# Patient Record
Sex: Male | Born: 1985 | Race: White | Hispanic: No | Marital: Single | Smoking: Current every day smoker
Health system: Southern US, Community
[De-identification: ages and names within clinical notes are randomized; demographics above are authoritative.]

---

## 2015-10-17 ENCOUNTER — Emergency Department
Admission: EM | Admit: 2015-10-17 | Discharge: 2015-10-17 | Disposition: A | Discharge status: Home / Self Care | Payer: BLUE CROSS/BLUE SHIELD | Source: Home / Self Care | Attending: Family Medicine | Admitting: Family Medicine

## 2015-10-17 ENCOUNTER — Emergency Department (INDEPENDENT_AMBULATORY_CARE_PROVIDER_SITE_OTHER): Payer: BLUE CROSS/BLUE SHIELD

## 2015-10-17 ENCOUNTER — Encounter: Payer: Self-pay | Admitting: *Deleted

## 2015-10-17 DIAGNOSIS — S92255A Nondisplaced fracture of navicular [scaphoid] of left foot, initial encounter for closed fracture: Secondary | ICD-10-CM | POA: Diagnosis not present

## 2015-10-17 DIAGNOSIS — X58XXXA Exposure to other specified factors, initial encounter: Secondary | ICD-10-CM | POA: Diagnosis not present

## 2015-10-17 DIAGNOSIS — S92252A Displaced fracture of navicular [scaphoid] of left foot, initial encounter for closed fracture: Secondary | ICD-10-CM | POA: Diagnosis not present

## 2015-10-17 MED ORDER — HYDROCODONE-ACETAMINOPHEN 5-325 MG PO TABS: 1.0000 | ORAL_TABLET | Freq: Four times a day (QID) | ORAL | Status: AC | PRN

## 2015-10-17 MED ORDER — IBUPROFEN 600 MG PO TABS: 600.0000 mg | ORAL_TABLET | Freq: Four times a day (QID) | ORAL | Status: AC | PRN

## 2015-10-17 NOTE — ED Notes (Signed)
Pt c/o LT foot pain x 3 days after jumping through a window in his garage. He took IBF at 12:00pm.

## 2015-10-17 NOTE — ED Provider Notes (Signed)
CSN: [id number]     Arrival date & time 10/17/15  1547 History   First MD Initiated Contact with Patient 10/17/15 1549     Chief Complaint  Patient presents with  . Foot Injury   (Consider location/radiation/quality/duration/timing/severity/associated sxs/prior Treatment) HPI  Pt is a 29yo male presenting to [facility] with c/o worsening Left foot pain and swelling that is worse with palpation, weight bearing and ambulation.  Symptoms started 3 days ago after he jumped through an 8 foot hight window in his garage, and landed on his Left foot.  Pain is aching and throbbing, 2/10 today after taking  Ibuprofen at home.  Pt states pain does keep him up at night and is a 9/10 when he walks on the foot. He has been using crutches to help himself get along. Denies any other injuries. Denies numbness or tingling in the Left foot.  History reviewed. No pertinent past medical history. History reviewed. No pertinent past surgical history. History reviewed. No pertinent family history. Social History  Substance Use Topics  . Smoking status: Current Every Day Smoker -- 0.50 packs/day    Types: Cigarettes  . Smokeless tobacco: None  . Alcohol Use: Yes     Comment: 2 drinks q wk    Review of Systems  Musculoskeletal: Positive for myalgias, joint swelling and arthralgias.       Left foot  Skin: Negative for color change and wound.  Neurological: Negative for weakness and numbness.    Allergies  Review of patient's allergies indicates no known allergies.  Home Medications   Prior to Admission medications   Medication Sig Start Date End Date Taking? Authorizing Provider  HYDROcodone-acetaminophen (NORCO/VICODIN) 5-325 MG tablet Take 1-2 tablets by mouth every 6 (six) hours as needed for moderate pain or severe pain. 10/17/15   Junius Finner, PA-C  ibuprofen (ADVIL,MOTRIN) 600 MG tablet Take 1 tablet (600 mg total) by mouth every 6 (six) hours as needed. 10/17/15   Junius Finner, PA-C   Meds  Ordered and Administered this Visit  Medications - No data to display  BP 123/80 mmHg  Pulse 75  Temp(Src) 98.4 F (36.9 C) (Oral)  Resp 16  Ht 6' (1.829 m)  Wt 185 lb (83.915 kg)  BMI 25.08 kg/m2  SpO2 100% No data found.   Physical Exam  Constitutional: He is oriented to person, place, and time. He appears well-developed and well-nourished.  HENT:  Head: Normocephalic and atraumatic.  Eyes: EOM are normal.  Neck: Normal range of motion.  Cardiovascular: Normal rate.   Pulses:      Dorsalis pedis pulses are 2+ on the left side.       Posterior tibial pulses are 2+ on the left side.  Left foot: cap refill < 3 seconds  Pulmonary/Chest: Effort normal.  Musculoskeletal: Normal range of motion. He exhibits edema and tenderness.  Left ankle: full ROM, no edema. Tenderness to medial aspect. Left foot: mild edema over medial dorsal aspect, tenderness to medial and dorsal aspect. Full ROM all toes. Calf is soft, non-tender.  Neurological: He is alert and oriented to person, place, and time.  Left foot: normal sensation  Skin: Skin is warm and dry.  Left foot: skin in tact. No ecchymosis or erythema.  Psychiatric: He has a normal mood and affect. His behavior is normal.  Nursing note and vitals reviewed.   ED Course  Procedures (including critical care time)  Labs Review Labs Reviewed - No data to display  Imaging Review Dg Ankle  Complete Left  10/17/2015  CLINICAL DATA:  Jumped out of when go with 8 foot drop. Foot and ankle pain. Symptoms  head injury 4 days ago. EXAM: LEFT ANKLE COMPLETE - 3+ VIEW COMPARISON:  None. FINDINGS: No acute bony abnormality. Specifically, no fracture, subluxation, or dislocation. Soft tissues are intact. Unable to visualize the navicular fracture as seen on foot series. IMPRESSION: No acute bony abnormality seen. Unable to visualize the navicular fracture seen on foot series. Electronically Signed   By: Charlett NoseKevin  Dover M.D.   On: 10/17/2015 16:35    Dg Foot Complete Left  10/17/2015  CLINICAL DATA:  Unable to bear weight on left foot. Pain for 4 days. Jumped out of a window with 8 foot drop. EXAM: LEFT FOOT - COMPLETE 3+ VIEW COMPARISON:  None. FINDINGS: Linear lucency through the navicular compatible with nondisplaced fracture. No additional fracture noted. Joint spaces are maintained. Soft tissues are intact. IMPRESSION: Nondisplaced navicular fracture. Electronically Signed   By: Charlett NoseKevin  Dover M.D.   On: 10/17/2015 16:33     MDM   1. Left navicular fracture of foot, closed, initial encounter     Pt c/o Left foot pain for 3 days after landing on it from jumping through his garage window. Pt denies any other injuries. Left foot: PMS in tact   Plain films: significant for a non-displaced navicular fracture of Left foot. Radiology report shows "head injury 4 days ago."  This is an incorrect typo. Pt denied any other injuries.   Pt placed in a boot. Advised to remain completely non-weight bearing until f/u with Dr. Benjamin Stainhekkekandam, Sports Medicine. Pt has crutches of his own that he brought with him to UC.  Rx: norco and ibuprofen Home care instructions provided.  Encouraged to call to schedule f/u appointment with Sports Medicine next week. Patient verbalized understanding and agreement with treatment plan.       Junius Finnerrin O'Malley, PA-C 10/17/15 [phone]

## 2015-10-17 NOTE — Discharge Instructions (Signed)
Norco/Vicodin (hydrocodone-acetaminophen) is a narcotic pain medication, do not combine these medications with others containing tylenol. While taking, do not drink alcohol, drive, or perform any other activities that requires focus while taking these medications.   PLEASE REMAIN COMPLETELY NON-WEIGHT BEARING until follow up with Dr. Benjamin Stainhekkekandam, Sports Medicine.  Use your crutches while getting around.  Your boot may be removed while bathing and while lying on a couch or sitting with foot elevated to ice the area 3-4 times a day to help reduce swelling.

## 2017-03-14 IMAGING — CR DG ANKLE COMPLETE 3+V*L*
3 series · 3 of 3 positions shown · non-contrast
Comparison: None.

ADDENDUM:
Typographical error in the clinical data which should read

Jumped out of window with 8 foot drop. Foot and ankle pain. Injury 4
days ago.
CLINICAL DATA: Jumped out of when go with 8 foot drop. Foot and
ankle pain. Symptoms ********* head injury 4 days ago.
EXAM:
LEFT ANKLE COMPLETE - 3+ VIEW

[ankle ap]
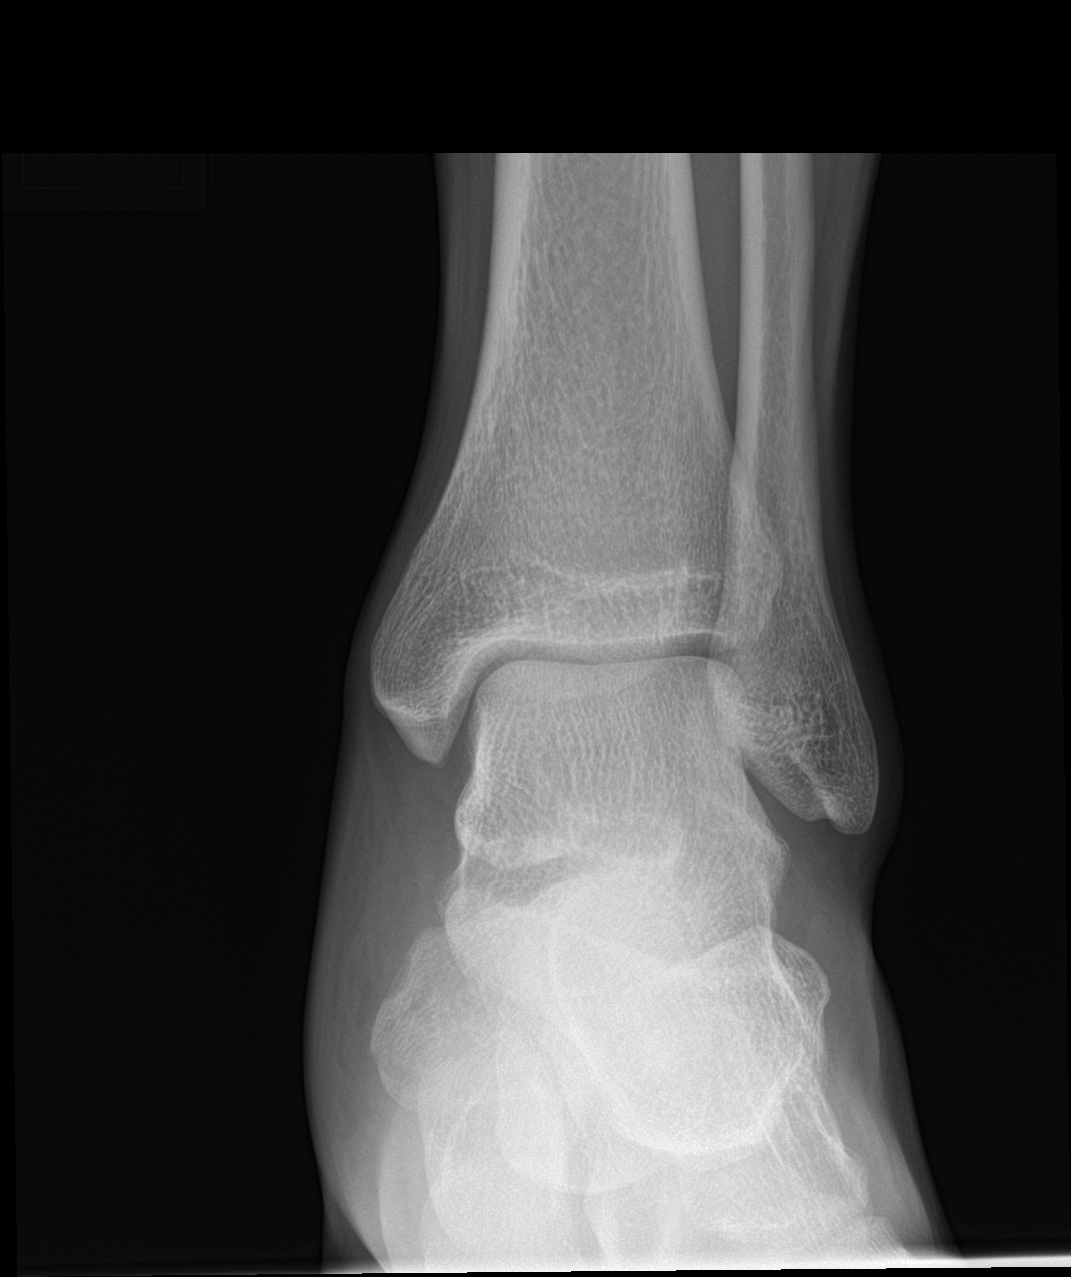

[ankle obl]
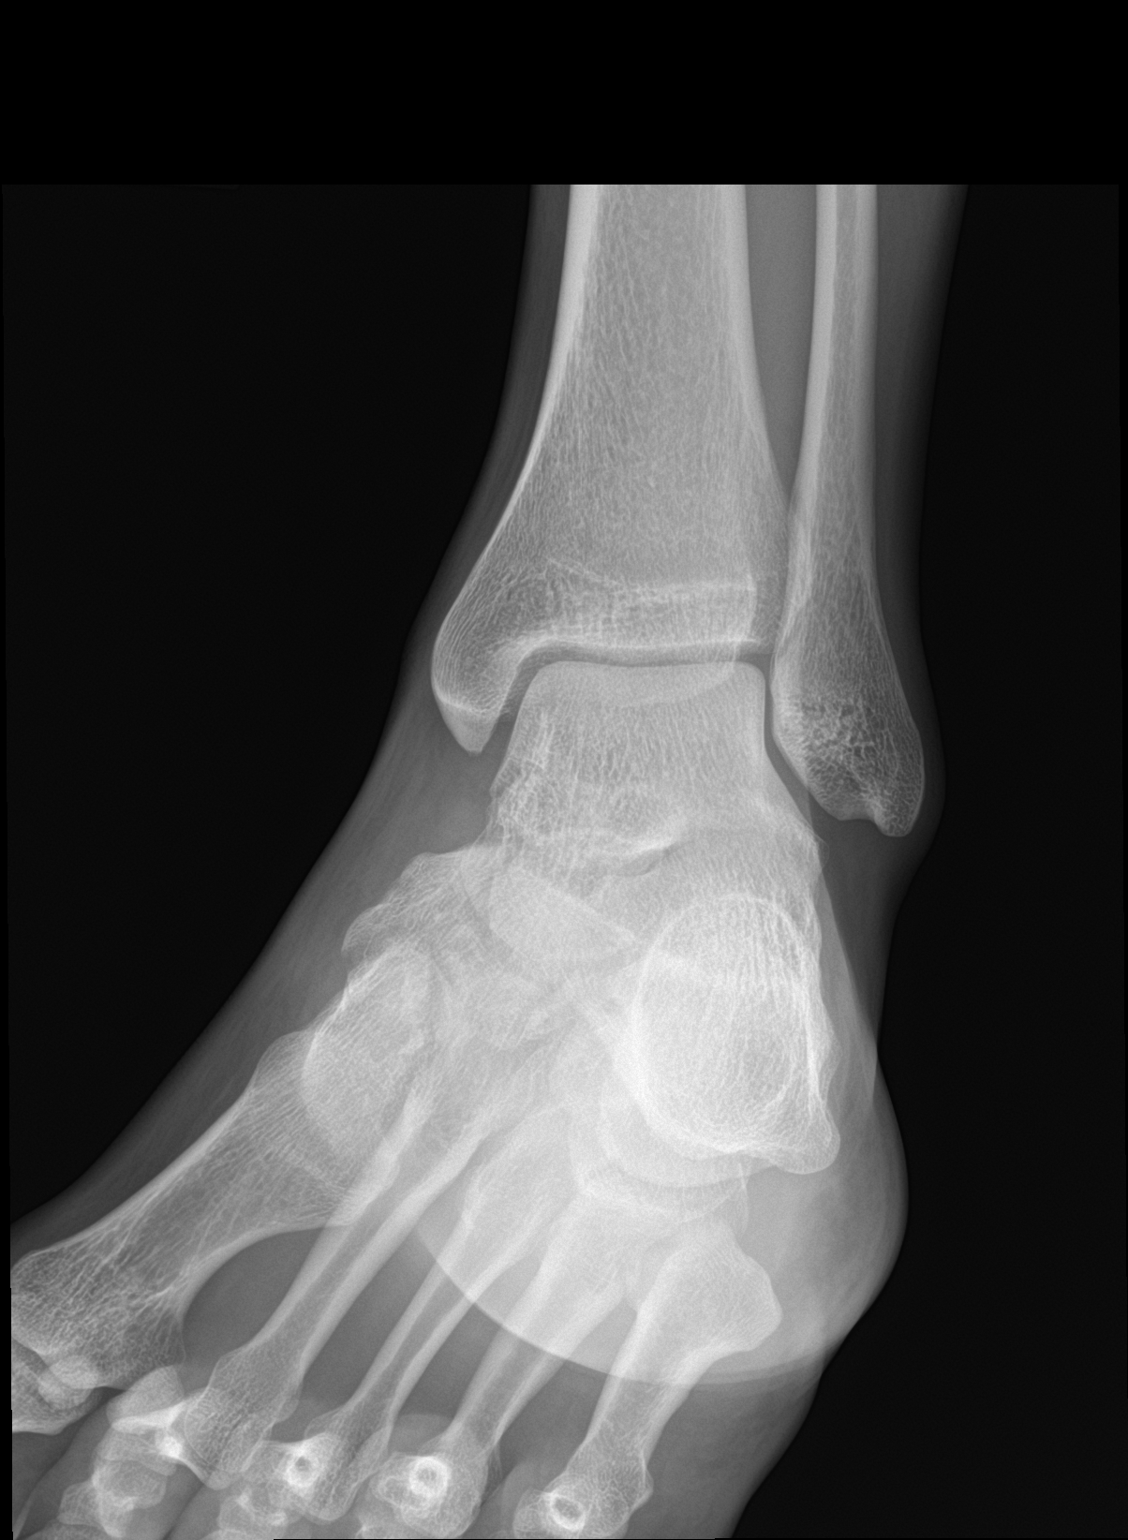

[ankle lat]
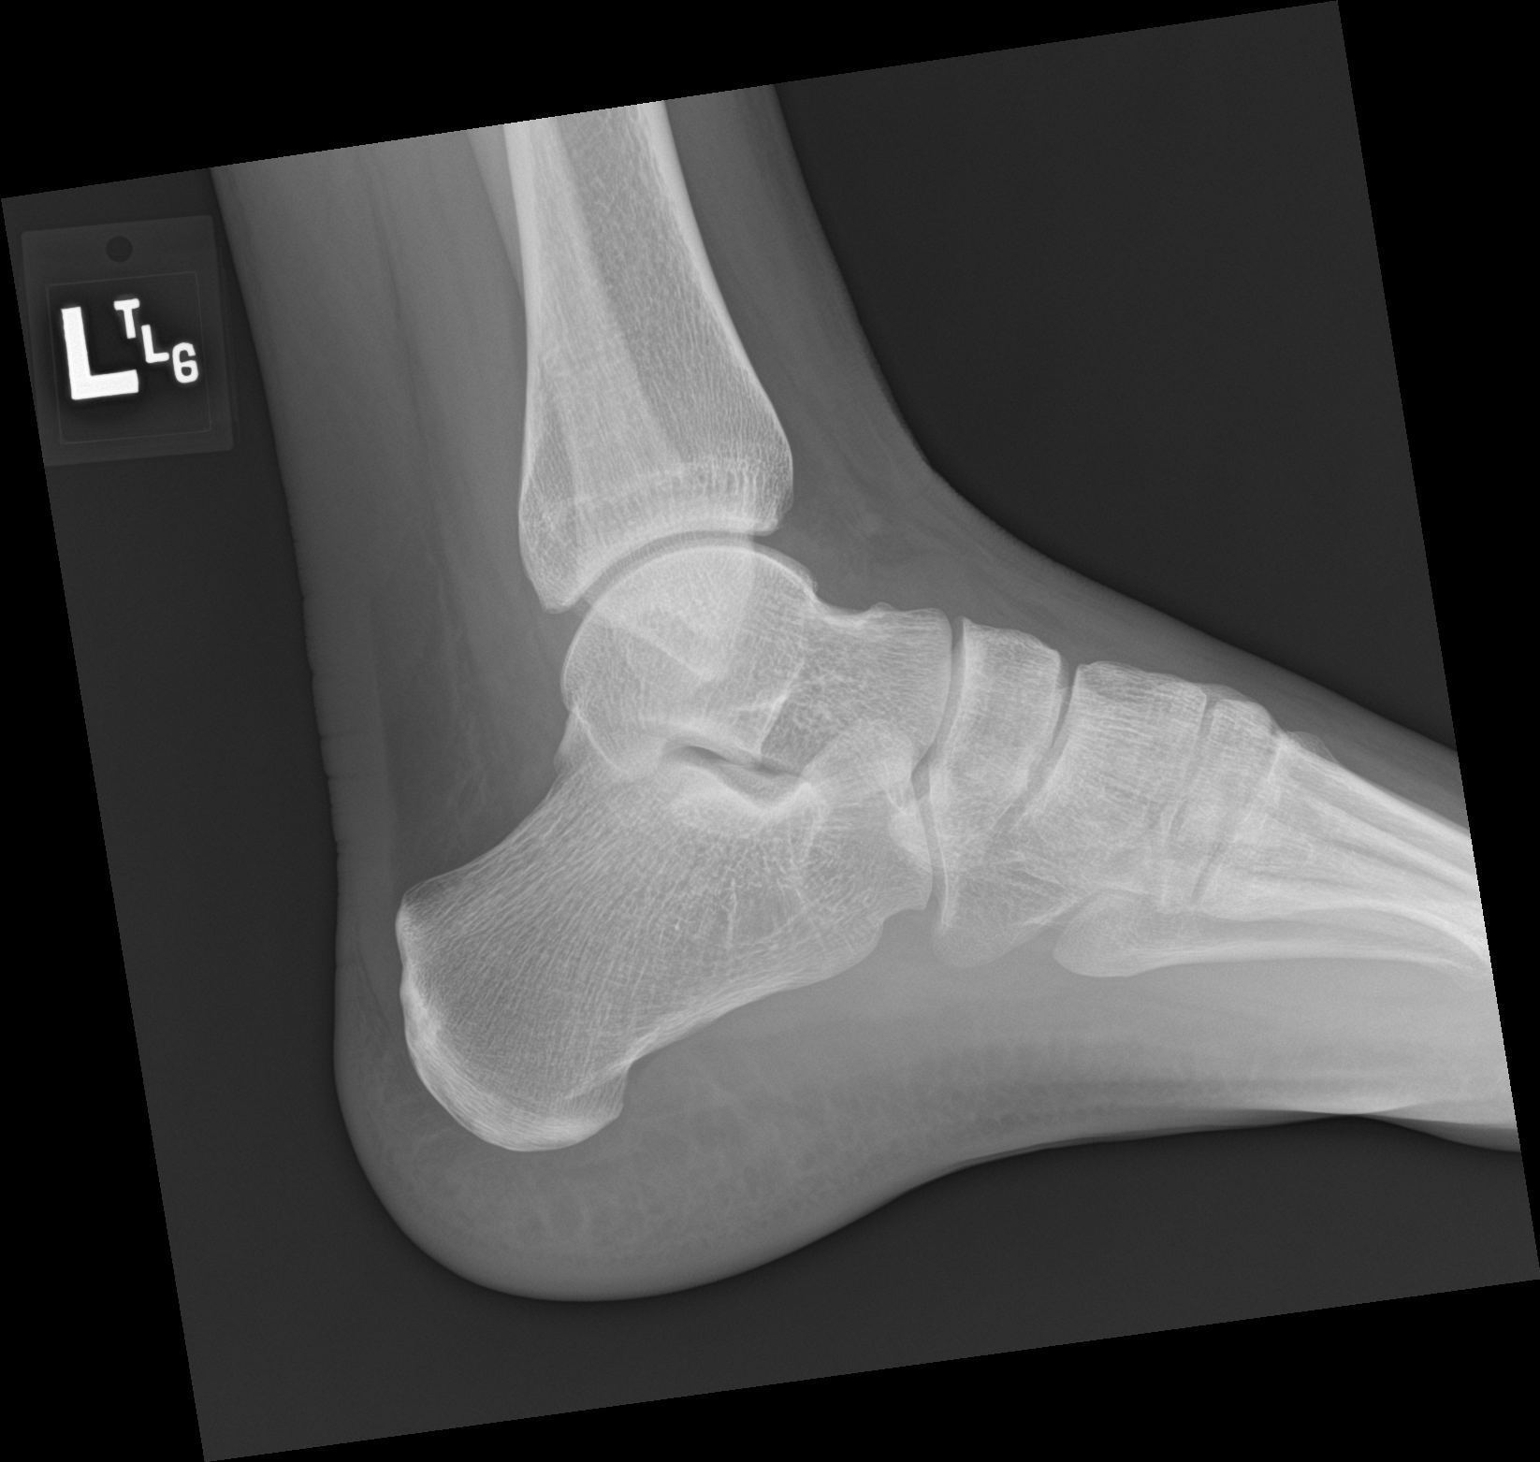

[3 of 3 positions shown; findings below may reference images not displayed]

FINDINGS: No acute bony abnormality. Specifically, no fracture, subluxation,
or dislocation. Soft tissues are intact. Unable to visualize the
navicular fracture as seen on foot series.
IMPRESSION: No acute bony abnormality seen. Unable to visualize the navicular
fracture seen on foot series.

## 2018-07-13 ENCOUNTER — Emergency Department (INDEPENDENT_AMBULATORY_CARE_PROVIDER_SITE_OTHER)
Admission: EM | Admit: 2018-07-13 | Discharge: 2018-07-13 | Disposition: A | Discharge status: Home / Self Care | Payer: BLUE CROSS/BLUE SHIELD | Source: Home / Self Care | Attending: Family Medicine | Admitting: Family Medicine

## 2018-07-13 ENCOUNTER — Other Ambulatory Visit: Payer: Self-pay

## 2018-07-13 DIAGNOSIS — S61210A Laceration without foreign body of right index finger without damage to nail, initial encounter: Secondary | ICD-10-CM | POA: Diagnosis not present

## 2018-07-13 DIAGNOSIS — Z23 Encounter for immunization: Secondary | ICD-10-CM

## 2018-07-13 MED ORDER — TETANUS-DIPHTH-ACELL PERTUSSIS 5-2.5-18.5 LF-MCG/0.5 IM SUSP
0.5000 mL | Freq: Once | INTRAMUSCULAR | Status: AC
  Administered 2018-07-13: 0.5 mL via INTRAMUSCULAR

## 2018-07-13 NOTE — ED Provider Notes (Signed)
[facility]    CSN: [id number] Arrival date & time: 07/13/18  1940     History   Chief Complaint Chief Complaint  Patient presents with  . Laceration    HPI Peter Hoffman is a 32 y.o. male.   HPI  Peter Hoffman is a 32 y.o. male presenting to UC with c/o laceration to his Right index finger after cutting it on broken glass while washing dishes. Bleeding controlled PTA. Pain is minimal. No pain medication taken PTA.  Last tetanus- unknown.    History reviewed. No pertinent past medical history.  There are no active problems to display for this patient.   History reviewed. No pertinent surgical history.     Home Medications    Prior to Admission medications   Medication Sig Start Date End Date Taking? Authorizing Provider  HYDROcodone-acetaminophen (NORCO/VICODIN) 5-325 MG tablet Take 1-2 tablets by mouth every 6 (six) hours as needed for moderate pain or severe pain. 10/17/15   Lurene ShadowPhelps, Ladonna Vanorder O, PA-C  ibuprofen (ADVIL,MOTRIN) 600 MG tablet Take 1 tablet (600 mg total) by mouth every 6 (six) hours as needed. 10/17/15   Lurene ShadowPhelps, Amrom Ore O, PA-C    Family History History reviewed. No pertinent family history.  Social History Social History   Tobacco Use  . Smoking status: Current Every Day Smoker    Packs/day: 0.50    Types: Cigarettes  Substance Use Topics  . Alcohol use: Yes    Comment: 2 drinks q wk  . Drug use: No     Allergies   Patient has no known allergies.   Review of Systems Review of Systems  Musculoskeletal: Negative for arthralgias, joint swelling and myalgias.  Skin: Positive for wound. Negative for color change.     Physical Exam Triage Vital Signs ED Triage Vitals  Enc Vitals Group     BP 07/13/18 1948 (!) 157/99     Pulse Rate 07/13/18 1948 86     Resp --      Temp 07/13/18 1948 98.7 F (37.1 C)     Temp Source 07/13/18 1948 Oral     SpO2 07/13/18 1948 95 %     Weight 07/13/18 1949 182 lb (82.6 kg)   Height 07/13/18 1949 6' (1.829 m)     Head Circumference --      Peak Flow --      Pain Score 07/13/18 1949 0     Pain Loc --      Pain Edu? --      Excl. in GC? --    No data found.  Updated Vital Signs BP (!) 157/99 (BP Location: Right Arm)   Pulse 86   Temp 98.7 F (37.1 C) (Oral)   Ht 6' (1.829 m)   Wt 182 lb (82.6 kg)   SpO2 95%   BMI 24.68 kg/m   Visual Acuity Right Eye Distance:   Left Eye Distance:   Bilateral Distance:    Right Eye Near:   Left Eye Near:    Bilateral Near:     Physical Exam  Constitutional: He is oriented to person, place, and time. He appears well-developed and well-nourished.  HENT:  Head: Normocephalic and atraumatic.  Eyes: EOM are normal.  Neck: Normal range of motion.  Cardiovascular: Normal rate.  Pulmonary/Chest: Effort normal.  Musculoskeletal: Normal range of motion. He exhibits no edema or tenderness.       Hands: Right index finger: Full ROM  Neurological: He is alert and oriented to person, place,  and time.  Skin: Skin is warm and dry. Capillary refill takes less than 2 seconds.  Right index finger, dorsal aspect, proximal phalanx: 2cm linear laceration. No tendon involvement. No foreign bodies seen or palpated. Small amount of bright red blood.   Psychiatric: He has a normal mood and affect. His behavior is normal.  Nursing note and vitals reviewed.    UC Treatments / Results  Labs (all labs ordered are listed, but only abnormal results are displayed) Labs Reviewed - No data to display  EKG None  Radiology No results found.  Procedures Laceration Repair Date/Time: 07/14/2018 9:45 AM Performed by: Lurene ShadowPhelps, Samuella Rasool O, PA-C Authorized by: Lattie HawBeese, Stephen A, MD   Consent:    Consent obtained:  Verbal   Consent given by:  Patient   Risks discussed:  Infection, pain and poor wound healing   Alternatives discussed:  No treatment (disccused steri-strips and finger splint) Anesthesia (see MAR for exact dosages):     Anesthesia method:  Local infiltration   Local anesthetic:  Lidocaine 1% w/o epi Laceration details:    Location:  Finger   Finger location:  R index finger   Length (cm):  2   Depth (mm):  2 Repair type:    Repair type:  Simple Pre-procedure details:    Preparation:  Patient was prepped and draped in usual sterile fashion Exploration:    Hemostasis achieved with:  Direct pressure   Wound exploration: wound explored through full range of motion and entire depth of wound probed and visualized     Wound extent: no areolar tissue violation noted, no fascia violation noted, no foreign bodies/material noted, no muscle damage noted, no nerve damage noted, no tendon damage noted, no underlying fracture noted and no vascular damage noted     Contaminated: no   Treatment:    Area cleansed with:  Saline   Amount of cleaning:  Standard Skin repair:    Repair method:  Sutures   Suture size:  4-0   Suture material:  Prolene   Suture technique:  Simple interrupted   Number of sutures:  3 Approximation:    Approximation:  Close Post-procedure details:    Dressing:  Bulky dressing   Patient tolerance of procedure:  Tolerated well, no immediate complications   (including critical care time)  Medications Ordered in UC Medications  Tdap (BOOSTRIX) injection 0.5 mL (0.5 mLs Intramuscular Given 07/13/18 2018)    Initial Impression / Assessment and Plan / UC Course  I have reviewed the triage vital signs and the nursing notes.  Pertinent labs & imaging results that were available during my care of the patient were reviewed by me and considered in my medical decision making (see chart for details).     Laceration sutured closed rather than steri-strips and finger splint due to pt being Right hand dominant and going out of town next week. Sutures will hold wound together better. Pt tolerated procedure well. Tdap updated today. Home instructions provided.  Final Clinical Impressions(s) / UC  Diagnoses   Final diagnoses:  Laceration of right index finger without foreign body without damage to nail, initial encounter     Discharge Instructions      Please return in 10-14 days for removal of sutures, sooner if concern for infection- increased pain, redness, drainage or pus.     ED Prescriptions    None     Controlled Substance Prescriptions [place] Controlled Substance Registry consulted? Not Applicable   Rolla Platehelps, Teddrick Mallari O, PA-C 07/14/18  0949  

## 2018-07-13 NOTE — Discharge Instructions (Signed)
°  Please return in 10-14 days for removal of sutures, sooner if concern for infection- increased pain, redness, drainage or pus.

## 2018-07-13 NOTE — ED Triage Notes (Signed)
Pt was washing dishes tonight and cut right ring finger on a glass.

## 2018-07-14 DIAGNOSIS — S61210A Laceration without foreign body of right index finger without damage to nail, initial encounter: Secondary | ICD-10-CM

## 2018-07-15 ENCOUNTER — Telehealth: Payer: Self-pay | Admitting: Emergency Medicine

## 2018-07-15 NOTE — Telephone Encounter (Signed)
Message left on voice mail inquiring about patient's status and encouraging patient to call with questions/concerns.
